# Patient Record
Sex: Female | Born: 1990 | Race: Black or African American | Hispanic: No | Marital: Single | Smoking: Never smoker
Health system: Southern US, Community
[De-identification: ages and names within clinical notes are randomized; demographics above are authoritative.]

## PROBLEM LIST (undated history)

## (undated) HISTORY — PX: TONSILLECTOMY: SUR1361

---

## 2015-02-11 ENCOUNTER — Emergency Department (HOSPITAL_COMMUNITY): Payer: No Typology Code available for payment source

## 2015-02-11 ENCOUNTER — Emergency Department (HOSPITAL_COMMUNITY)
Admission: EM | Admit: 2015-02-11 | Discharge: 2015-02-11 | Disposition: A | Discharge status: Home / Self Care | Payer: No Typology Code available for payment source | Attending: Emergency Medicine | Admitting: Emergency Medicine

## 2015-02-11 ENCOUNTER — Encounter (HOSPITAL_COMMUNITY): Payer: Self-pay | Admitting: *Deleted

## 2015-02-11 DIAGNOSIS — S0993XA Unspecified injury of face, initial encounter: Secondary | ICD-10-CM | POA: Diagnosis not present

## 2015-02-11 DIAGNOSIS — S4992XA Unspecified injury of left shoulder and upper arm, initial encounter: Secondary | ICD-10-CM | POA: Insufficient documentation

## 2015-02-11 DIAGNOSIS — Z3202 Encounter for pregnancy test, result negative: Secondary | ICD-10-CM | POA: Diagnosis not present

## 2015-02-11 DIAGNOSIS — Y92410 Unspecified street and highway as the place of occurrence of the external cause: Secondary | ICD-10-CM | POA: Diagnosis not present

## 2015-02-11 DIAGNOSIS — S0990XA Unspecified injury of head, initial encounter: Secondary | ICD-10-CM | POA: Diagnosis not present

## 2015-02-11 DIAGNOSIS — S3992XA Unspecified injury of lower back, initial encounter: Secondary | ICD-10-CM | POA: Diagnosis not present

## 2015-02-11 DIAGNOSIS — Y999 Unspecified external cause status: Secondary | ICD-10-CM | POA: Diagnosis not present

## 2015-02-11 DIAGNOSIS — R110 Nausea: Secondary | ICD-10-CM | POA: Insufficient documentation

## 2015-02-11 DIAGNOSIS — Z720 Tobacco use: Secondary | ICD-10-CM | POA: Insufficient documentation

## 2015-02-11 DIAGNOSIS — S199XXA Unspecified injury of neck, initial encounter: Secondary | ICD-10-CM | POA: Diagnosis present

## 2015-02-11 DIAGNOSIS — Y9389 Activity, other specified: Secondary | ICD-10-CM | POA: Diagnosis not present

## 2015-02-11 LAB — POC URINE PREG, ED: Preg Test, Ur: NEGATIVE

## 2015-02-11 MED ORDER — METHOCARBAMOL 500 MG PO TABS
500.0000 mg | ORAL_TABLET | Freq: Once | ORAL | Status: AC
  Administered 2015-02-11: 500 mg via ORAL
  Filled 2015-02-11: qty 1

## 2015-02-11 MED ORDER — NAPROXEN 375 MG PO TABS: 375.0000 mg | ORAL_TABLET | Freq: Two times a day (BID) | ORAL | Status: DC

## 2015-02-11 MED ORDER — METHOCARBAMOL 500 MG PO TABS: 500.0000 mg | ORAL_TABLET | Freq: Two times a day (BID) | ORAL | Status: DC

## 2015-02-11 MED ORDER — KETOROLAC TROMETHAMINE 60 MG/2ML IM SOLN
60.0000 mg | Freq: Once | INTRAMUSCULAR | Status: AC
Start: 2015-02-11 — End: 2015-02-11
  Administered 2015-02-11: 60 mg via INTRAMUSCULAR
  Filled 2015-02-11: qty 2
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
## 2015-02-11 NOTE — Discharge Instructions (Signed)

## 2015-08-13 ENCOUNTER — Other Ambulatory Visit (HOSPITAL_COMMUNITY)
Admission: RE | Admit: 2015-08-13 | Discharge: 2015-08-13 | Disposition: A | Discharge status: Home / Self Care | Payer: Managed Care, Other (non HMO) | Source: Ambulatory Visit | Attending: Obstetrics and Gynecology | Admitting: Obstetrics and Gynecology

## 2015-08-13 ENCOUNTER — Other Ambulatory Visit: Payer: Self-pay | Admitting: Obstetrics and Gynecology

## 2015-08-13 DIAGNOSIS — Z113 Encounter for screening for infections with a predominantly sexual mode of transmission: Secondary | ICD-10-CM | POA: Diagnosis present

## 2015-08-13 DIAGNOSIS — Z01419 Encounter for gynecological examination (general) (routine) without abnormal findings: Secondary | ICD-10-CM | POA: Diagnosis present

## 2015-08-14 LAB — CYTOLOGY - PAP

## 2016-07-15 ENCOUNTER — Encounter (HOSPITAL_COMMUNITY): Payer: Self-pay | Admitting: *Deleted

## 2016-07-15 ENCOUNTER — Emergency Department (HOSPITAL_COMMUNITY)
Admission: EM | Admit: 2016-07-15 | Discharge: 2016-07-16 | Disposition: A | Discharge status: Home / Self Care | Payer: Managed Care, Other (non HMO) | Attending: Emergency Medicine | Admitting: Emergency Medicine

## 2016-07-15 DIAGNOSIS — F17210 Nicotine dependence, cigarettes, uncomplicated: Secondary | ICD-10-CM | POA: Insufficient documentation

## 2016-07-15 DIAGNOSIS — N939 Abnormal uterine and vaginal bleeding, unspecified: Secondary | ICD-10-CM

## 2016-07-15 LAB — URINALYSIS, ROUTINE W REFLEX MICROSCOPIC
BILIRUBIN URINE: NEGATIVE
Glucose, UA: NEGATIVE mg/dL
KETONES UR: NEGATIVE mg/dL
LEUKOCYTES UA: NEGATIVE
Nitrite: NEGATIVE
PROTEIN: NEGATIVE mg/dL
Specific Gravity, Urine: 1.017 (ref 1.005–1.030)
pH: 5 (ref 5.0–8.0)

## 2016-07-15 LAB — POC URINE PREG, ED: PREG TEST UR: NEGATIVE

## 2016-07-15 NOTE — ED Provider Notes (Signed)
[facility]-EMERGENCY DEPT Provider Note   CSN: [id number] Arrival date & time: 07/15/16  2024  By signing my name below, I, Cynda Acres, attest that this documentation has been prepared under the direction and in the presence of Brittnei Jagiello PA-C. Electronically Signed: Cynda Acres, Scribe. 07/15/16. 12:27 AM.  History   Chief Complaint Chief Complaint  Patient presents with  . Vaginal Bleeding  . Abdominal Pain   HPI Comments: Sheryl Walker is a 26 y.o. female with no pertinent medical history, who presents to the Emergency Department complaining of sudden-onset, intermittent heavy vaginal bleeding that began this morning. Patient describes her bleeding as "stacks of blood" flowing. Patient states she has changed her tampon multiple times throughout the day. Patient denies any history of anemia. Patient states her symptoms are similar to prior miscarriages.  Patient reports associated lower abdominal pain and vomiting x3 episodes. No modifying factors indicated. Patient states abnormal menstural periods are normal for her, as she is unsure when they will come each month. Patient is sexually active. Patient denies any vaginal discharge, light-headedness, dysuria, or urinary frequency.   The history is provided by the patient. No language interpreter was used.    History reviewed. No pertinent past medical history.  There are no active problems to display for this patient.   Past Surgical History:  Procedure Laterality Date  . TONSILLECTOMY      OB History    No data available       Home Medications    Prior to Admission medications   Medication Sig Start Date End Date Taking? Authorizing Provider  methocarbamol (ROBAXIN) 500 MG tablet Take 1 tablet (500 mg total) by mouth 2 (two) times daily. 02/11/15   April Palumbo, MD  naproxen (NAPROSYN) 375 MG tablet Take 1 tablet (375 mg total) by mouth 2 (two) times daily. 02/11/15   April Palumbo, MD    Family History No family  history on file.  Social History Social History  Substance Use Topics  . Smoking status: Current Every Day Smoker    Packs/day: 0.50    Types: Cigarettes  . Smokeless tobacco: Never Used  . Alcohol use Yes     Allergies   Patient has no known allergies.   Review of Systems Review of Systems  Constitutional: Negative for fever.  Gastrointestinal: Positive for abdominal pain (lower) and vomiting.  Genitourinary: Positive for vaginal bleeding. Negative for dysuria and vaginal discharge.  Musculoskeletal: Negative for back pain.  Skin: Negative for pallor.  Neurological: Negative for light-headedness.     Physical Exam Updated Vital Signs BP (!) 150/89 (BP Location: Left Wrist)   Pulse 100   Temp 98.9 F (37.2 C) (Oral)   Resp 15   Ht 5\' 7"  (1.702 m)   Wt (!) 360 lb (163.3 kg)   LMP 07/15/2016   SpO2 100%   BMI 56.38 kg/m   Physical Exam  Constitutional: She is oriented to person, place, and time. She appears well-developed. No distress.  HENT:  Head: Normocephalic and atraumatic.  Mouth/Throat: Oropharynx is clear and moist.  Eyes:  No conjunctival pallor.   Neck: Normal range of motion. Neck supple.  Cardiovascular: Normal rate.   Pulmonary/Chest: Effort normal.  Abdominal: Soft. Bowel sounds are normal. She exhibits no mass. There is no tenderness.     Genitourinary:  Genitourinary Comments: Minimal cervical bleeding. No products of conception. No other discharge visualized. No cervical tenderness. No adnexal mass or tenderness. Exam limited by body habitus  Neurological: She is  alert and oriented to person, place, and time.  Skin: Skin is warm and dry.     ED Treatments / Results  DIAGNOSTIC STUDIES: Oxygen Saturation is 100% on RA, normal by my interpretation.    COORDINATION OF CARE: 12:27 AM Discussed treatment plan with pt at bedside and pt agreed to plan, which includes pelvic exam and basic labs.   Labs (all labs ordered are listed, but only  abnormal results are displayed) Labs Reviewed  URINALYSIS, ROUTINE W REFLEX MICROSCOPIC - Abnormal; Notable for the following:       Result Value   Hgb urine dipstick LARGE (*)    Bacteria, UA RARE (*)    Squamous Epithelial / LPF 0-5 (*)    All other components within normal limits  POC URINE PREG, ED    EKG  EKG Interpretation None       Radiology No results found.  Procedures Procedures (including critical care time)  Medications Ordered in ED Medications - No data to display   Initial Impression / Assessment and Plan / ED Course  I have reviewed the triage vital signs and the nursing notes.  Pertinent labs & imaging results that were available during my care of the patient were reviewed by me and considered in my medical decision making (see chart for details).     Patient presents with abnormal vaginal bleeding and she was concerned for recurrent miscarriage. The patient reports urine pregnancy tests have been negative in the past when serum tests have shown positive. Serum test also negative. No evidence of infection. She is nontender to exam. No anemia. She is felt stable for discharge home. Will provide referral to GYN.  Final Clinical Impressions(s) / ED Diagnoses   Final diagnoses:  None   1. Abnormal vaginal bleeding  New Prescriptions New Prescriptions   No medications on file   I personally performed the services described in this documentation, which was scribed in my presence. The recorded information has been reviewed and is accurate.     Elpidio AnisShari Molley Houser, PA-C 07/17/16 [phone]    Pricilla LovelessScott Goldston, MD 07/17/16 [phone]

## 2016-07-15 NOTE — ED Triage Notes (Signed)
Pt complains of lower abdominal pain and heavier than normal vaginal bleeding since this morning. Pt denies diarrhea or nausea. Pt states she vomited 3 times this morning.

## 2016-07-16 ENCOUNTER — Encounter (HOSPITAL_COMMUNITY): Payer: Self-pay | Admitting: *Deleted

## 2016-07-16 ENCOUNTER — Inpatient Hospital Stay (HOSPITAL_COMMUNITY)
Admission: AD | Admit: 2016-07-16 | Discharge: 2016-07-16 | Disposition: A | Discharge status: Home / Self Care | Payer: Self-pay | Source: Ambulatory Visit | Attending: Obstetrics and Gynecology | Admitting: Obstetrics and Gynecology

## 2016-07-16 ENCOUNTER — Inpatient Hospital Stay (HOSPITAL_COMMUNITY): Payer: Self-pay

## 2016-07-16 DIAGNOSIS — Z79899 Other long term (current) drug therapy: Secondary | ICD-10-CM | POA: Insufficient documentation

## 2016-07-16 DIAGNOSIS — N938 Other specified abnormal uterine and vaginal bleeding: Secondary | ICD-10-CM | POA: Insufficient documentation

## 2016-07-16 DIAGNOSIS — N970 Female infertility associated with anovulation: Secondary | ICD-10-CM | POA: Insufficient documentation

## 2016-07-16 DIAGNOSIS — R030 Elevated blood-pressure reading, without diagnosis of hypertension: Secondary | ICD-10-CM

## 2016-07-16 DIAGNOSIS — F17210 Nicotine dependence, cigarettes, uncomplicated: Secondary | ICD-10-CM | POA: Insufficient documentation

## 2016-07-16 DIAGNOSIS — N939 Abnormal uterine and vaginal bleeding, unspecified: Secondary | ICD-10-CM

## 2016-07-16 LAB — CBC
HCT: 39.8 % (ref 36.0–46.0)
HEMATOCRIT: 38 % (ref 36.0–46.0)
HEMOGLOBIN: 12.7 g/dL (ref 12.0–15.0)
Hemoglobin: 12.2 g/dL (ref 12.0–15.0)
MCH: 27 pg (ref 26.0–34.0)
MCH: 27.2 pg (ref 26.0–34.0)
MCHC: 32.1 g/dL (ref 30.0–36.0)
MCHC: 31.9 g/dL (ref 30.0–36.0)
MCV: 84.5 fL (ref 78.0–100.0)
MCV: 84.8 fL (ref 78.0–100.0)
PLATELETS: 268 10*3/uL (ref 150–400)
Platelets: 259 10*3/uL (ref 150–400)
RBC: 4.48 MIL/uL (ref 3.87–5.11)
RBC: 4.71 MIL/uL (ref 3.87–5.11)
RDW: 14.7 % (ref 11.5–15.5)
RDW: 14.7 % (ref 11.5–15.5)
WBC: 10.9 10*3/uL — AB (ref 4.0–10.5)
WBC: 8.9 10*3/uL (ref 4.0–10.5)

## 2016-07-16 LAB — URINALYSIS, ROUTINE W REFLEX MICROSCOPIC
BACTERIA UA: NONE SEEN
Bilirubin Urine: NEGATIVE
GLUCOSE, UA: NEGATIVE mg/dL
KETONES UR: NEGATIVE mg/dL
Leukocytes, UA: NEGATIVE
Nitrite: NEGATIVE
PROTEIN: NEGATIVE mg/dL
Specific Gravity, Urine: 1.018 (ref 1.005–1.030)
pH: 7 (ref 5.0–8.0)

## 2016-07-16 LAB — GC/CHLAMYDIA PROBE AMP (~~LOC~~) NOT AT ARMC
CHLAMYDIA, DNA PROBE: NEGATIVE
NEISSERIA GONORRHEA: NEGATIVE

## 2016-07-16 LAB — I-STAT BETA HCG BLOOD, ED (MC, WL, AP ONLY): I-stat hCG, quantitative: <5 m[IU]/mL (ref <5)

## 2016-07-16 LAB — WET PREP, GENITAL
Sperm: NONE SEEN
TRICH WET PREP: NONE SEEN
YEAST WET PREP: NONE SEEN

## 2016-07-16 MED ORDER — MEDROXYPROGESTERONE ACETATE 10 MG PO TABS: 10.0000 mg | ORAL_TABLET | Freq: Every day | ORAL | 0 refills | Status: AC

## 2016-07-16 MED ORDER — NAPROXEN 375 MG PO TABS: 375.0000 mg | ORAL_TABLET | Freq: Two times a day (BID) | ORAL | 0 refills | Status: AC

## 2016-07-16 NOTE — MAU Note (Signed)
Pt c/o bad cramps in lower abdomen for the last three days. Pt states she was seen in ER at [facility] last night and was told everything was OK. Pt states this morning when she took a shower she has several big blood clots come out. Pt states she has been spotting since last Tuesday. Pt states the bleeding increased yesterday and has been on-going since then. Pt states her periods are normally irregular.

## 2016-07-16 NOTE — MAU Provider Note (Signed)
Chief Complaint: Abdominal Pain and Vaginal Bleeding   SUBJECTIVE HPI: Sheryl Walker is a 26 y.o. G2P0020 who presents to Maternity Admissions reporting significant vaginal bleeding with cramping.  Patient was just seen in [facility] just over 12 hours ago for the same complaints. She's had spotting for the past 2 weeks. With significant bleeding starting yesterday and today. Her hemoglobin at the time was 10.9, without any significant vaginal bleeding noted on exam. Patient was sent home from the ER stable and told to follow up with OB/GYN. Patient returned this time to MAU due to 5-6 large blood clots that had been passed throughout the day, about the size of baseballs. She is not soaking through any pads, but having to change a super size tampon every few hours or so. She'll occasionally go times without any bleeding throughout the day but then would pass large clots as noted above. She states that she has very infrequent menses throughout the year, but has never had large bleeding like this before. She has tried nexplanon in the past with good success, not having any vaginal bleeding during the implant for 3 years. She has tried OCPs, but that did not help regulate her periods. She has been off birth control for a couple of years. She admits to some mild pelvic cramping that is also associated with some nausea. She has an appointment with her OB/GYN on April 30, but has not been seen for a while.     History reviewed. No pertinent past medical history. OB History  Gravida Para Term Preterm AB Living  2       2    SAB TAB Ectopic Multiple Live Births  2            # Outcome Date GA Lbr Len/2nd Weight Sex Delivery Anes PTL Lv  2 SAB 2010          1 SAB 2010             Past Surgical History:  Procedure Laterality Date  . TONSILLECTOMY     Social History   Social History  . Marital status: Single    Spouse name: N/A  . Number of children: N/A  . Years of education: N/A    Occupational History  . Not on file.   Social History Main Topics  . Smoking status: Current Every Day Smoker    Packs/day: 0.50    Types: Cigarettes  . Smokeless tobacco: Never Used  . Alcohol use Yes  . Drug use: Yes    Types: Marijuana  . Sexual activity: Yes    Birth control/ protection: None   Other Topics Concern  . Not on file   Social History Narrative  . No narrative on file   No current facility-administered medications on file prior to encounter.    Current Outpatient Prescriptions on File Prior to Encounter  Medication Sig Dispense Refill  . acidophilus (RISAQUAD) CAPS capsule Take by mouth daily.    . naproxen (NAPROSYN) 375 MG tablet Take 1 tablet (375 mg total) by mouth 2 (two) times daily. 20 tablet 0  . OVER THE COUNTER MEDICATION Take 1 tablet by mouth daily.     No Known Allergies  I have reviewed the past Medical Hx, Surgical Hx, Social Hx, Allergies and Medications.   REVIEW OF SYSTEMS  A comprehensive ROS was negative except per HPI.   OBJECTIVE Patient Vitals for the past 24 hrs:  BP Temp Temp src Pulse Resp SpO2  07/16/16 2000 106/68 - - 95 - -  07/16/16 1723 (!) 150/72 98.7 F (37.1 C) Oral 93 18 100 %    PHYSICAL EXAM Constitutional: Well-developed, well-nourished severely morbidly obese female in no acute distress.  Cardiovascular: normal rate Respiratory: normal rate and effort.  GI: Abd soft, non-tender, non-distended. Pos BS x 4 MS: Extremities nontender, no edema, normal ROM Neurologic: Alert and oriented x 4.  GU: Neg CVAT. SPECULUM EXAM: NEFG, physiologic discharge, no active bleeding from cervical os, no clots noted in posterior fornix, <3cc dark red blood, cervix clean BIMANUAL: cervix closed; uterus normal size, no adnexal tenderness or masses but very limited due to morbidly obese. No CMT.  LAB RESULTS Results for orders placed or performed during the hospital encounter of 07/16/16 (from the past 24 hour(s))   Urinalysis, Routine w reflex microscopic     Status: Abnormal   Collection Time: 07/16/16  5:20 PM  Result Value Ref Range   Color, Urine YELLOW YELLOW   APPearance CLEAR CLEAR   Specific Gravity, Urine 1.018 1.005 - 1.030   pH 7.0 5.0 - 8.0   Glucose, UA NEGATIVE NEGATIVE mg/dL   Hgb urine dipstick SMALL (A) NEGATIVE   Bilirubin Urine NEGATIVE NEGATIVE   Ketones, ur NEGATIVE NEGATIVE mg/dL   Protein, ur NEGATIVE NEGATIVE mg/dL   Nitrite NEGATIVE NEGATIVE   Leukocytes, UA NEGATIVE NEGATIVE   RBC / HPF 0-5 0 - 5 RBC/hpf   WBC, UA 0-5 0 - 5 WBC/hpf   Bacteria, UA NONE SEEN NONE SEEN   Squamous Epithelial / LPF 0-5 (A) NONE SEEN   Mucous PRESENT   CBC     Status: None   Collection Time: 07/16/16  6:08 PM  Result Value Ref Range   WBC 8.9 4.0 - 10.5 K/uL   RBC 4.71 3.87 - 5.11 MIL/uL   Hemoglobin 12.7 12.0 - 15.0 g/dL   HCT 69.6 29.5 - 28.4 %   MCV 84.5 78.0 - 100.0 fL   MCH 27.0 26.0 - 34.0 pg   MCHC 31.9 30.0 - 36.0 g/dL   RDW 13.2 44.0 - 10.2 %   Platelets 259 150 - 400 K/uL    IMAGING US Transvaginal Non-ob  Result Date: 07/16/2016 CLINICAL DATA:  Initial evaluation for acute abdominal/ pelvic cramping, abnormal uterine bleeding. History of morbid obesity. EXAM: TRANSABDOMINAL AND TRANSVAGINAL ULTRASOUND OF PELVIS TECHNIQUE: Both transabdominal and transvaginal ultrasound examinations of the pelvis were performed. Transabdominal technique was performed for global imaging of the pelvis including uterus, ovaries, adnexal regions, and pelvic cul-de-sac. It was necessary to proceed with endovaginal exam following the transabdominal exam to visualize the uterus and ovaries. COMPARISON:  None FINDINGS: Uterus Measurements: 7.6 x 2.7 x 2.5 cm. No fibroids or other mass visualized. Endometrium Thickness: 8.4 mm.  No focal abnormality visualized. Right ovary Measurements: 3.0 x 1.5 x 1.4 cm. Grossly normal appearance/no adnexal mass. Left ovary Measurements: 2.4 x 1.6 x 1.7 cm. Grossly  normal appearance/no adnexal mass. Other findings No abnormal free fluid. Please note study somewhat limited due to patient body habitus. IMPRESSION: Negative pelvic ultrasound.  No acute abnormality identified. Electronically Signed   By: Rise Mu M.D.   On: 07/16/2016 19:40   US Pelvis Complete  Result Date: 07/16/2016 CLINICAL DATA:  Initial evaluation for acute abdominal/ pelvic cramping, abnormal uterine bleeding. History of morbid obesity. EXAM: TRANSABDOMINAL AND TRANSVAGINAL ULTRASOUND OF PELVIS TECHNIQUE: Both transabdominal and transvaginal ultrasound examinations of the pelvis were performed. Transabdominal technique was performed for global  imaging of the pelvis including uterus, ovaries, adnexal regions, and pelvic cul-de-sac. It was necessary to proceed with endovaginal exam following the transabdominal exam to visualize the uterus and ovaries. COMPARISON:  None FINDINGS: Uterus Measurements: 7.6 x 2.7 x 2.5 cm. No fibroids or other mass visualized. Endometrium Thickness: 8.4 mm.  No focal abnormality visualized. Right ovary Measurements: 3.0 x 1.5 x 1.4 cm. Grossly normal appearance/no adnexal mass. Left ovary Measurements: 2.4 x 1.6 x 1.7 cm. Grossly normal appearance/no adnexal mass. Other findings No abnormal free fluid. Please note study somewhat limited due to patient body habitus. IMPRESSION: Negative pelvic ultrasound.  No acute abnormality identified. Electronically Signed   By: Rise Mu M.D.   On: 07/16/2016 19:40    MAU COURSE Hb 10.9 (last night) > 8.9 (today) SSE - minimal blood noted, no active bleeding or clots TVUS - normal ovaries, no fibroids, 8.31mm endometrium thickness (reviewed labs from last night ER visit, neg BHCG, neg GC/CT)  7:24 PM - Spoke with Dr. Su Hilt, suggested to start Provera  qday for 14 days with follow up in office before end of pills. OK for discharge.  MDM Plan of care reviewed with patient, including labs and tests  ordered and medical treatment. Bleeding precautions for when to return to ER. Provera for 14 days until able to follow up with OB/GYN. To call office to have follow up before 2 weeks.    ASSESSMENT 1. Anovulatory (dysfunctional uterine) bleeding   2. Abnormal uterine bleeding   3. Elevated blood pressure reading without diagnosis of hypertension     PLAN Discharge home in stable condition. Provera Bleeding precautions F/U w/ OB/GYN  Follow-up Information    COLE,TARA J., MD. Schedule an appointment as soon as possible for a visit in 1 week(s).   Specialty:  Obstetrics and Gynecology Why:  Follow up in 1-2 weeks in office for bleeding before finish pills Contact information: [address] [place] [address] [phone]          Allergies as of 07/16/2016   No Known Allergies     Medication List    STOP taking these medications   methocarbamol 500 MG tablet Commonly known as:  ROBAXIN     TAKE these medications   acidophilus Caps capsule Take by mouth daily.   medroxyPROGESTERone 10 MG tablet Commonly known as:  PROVERA Take 1 tablet (10 mg total) by mouth daily. Take for 2 weeks.   naproxen 375 MG tablet Commonly known as:  NAPROSYN Take 1 tablet (375 mg total) by mouth 2 (two) times daily.   OVER THE COUNTER MEDICATION Take 1 tablet by mouth daily.        Jen Mow, DO [place] [facility] 07/16/2016 7:24 PM

## 2016-07-16 NOTE — Discharge Instructions (Signed)
Abnormal Uterine Bleeding Abnormal uterine bleeding means bleeding more than usual from your uterus. It can include:  Bleeding between periods.  Bleeding after sex.  Bleeding that is heavier than normal.  Periods that last longer than usual.  Bleeding after you have stopped having your period (menopause). There are many problems that may cause this. You should see a doctor for any kind of bleeding that is not normal. Treatment depends on the cause of the bleeding. Follow these instructions at home:  Watch your condition for any changes.  Do not use tampons, douche, or have sex, if your doctor tells you not to.  Change your pads often.  Get regular well-woman exams. Make sure they include a pelvic exam and cervical cancer screening.  Keep all follow-up visits as told by your doctor. This is important. Contact a doctor if:  The bleeding lasts more than one week.  You feel dizzy at times.  You feel like you are going to throw up (nauseous).  You throw up. Get help right away if:  You pass out.  You have to change pads every hour.  You have belly (abdominal) pain.  You have a fever.  You get sweaty.  You get weak.  You passing large blood clots from your vagina. Summary  Abnormal uterine bleeding means bleeding more than usual from your uterus.  There are many problems that may cause this. You should see a doctor for any kind of bleeding that is not normal.  Treatment depends on the cause of the bleeding. This information is not intended to replace advice given to you by your health care provider. Make sure you discuss any questions you have with your health care provider. Document Released: 01/31/2009 Document Revised: 03/30/2016 Document Reviewed: 03/30/2016 Elsevier Interactive Patient Education  2017 [employer]   Dysfunctional Uterine Bleeding Dysfunctional uterine bleeding is abnormal bleeding from the uterus. Dysfunctional uterine bleeding  includes:  A period that comes earlier or later than usual.  A period that is lighter, heavier, or has blood clots.  Bleeding between periods.  Skipping one or more periods.  Bleeding after sexual intercourse.  Bleeding after menopause. Follow these instructions at home: Pay attention to any changes in your symptoms. Follow these instructions to help with your condition: Eating and drinking   Eat well-balanced meals. Include foods that are high in iron, such as liver, meat, shellfish, green leafy vegetables, and eggs.  If you become constipated:  Drink plenty of water.  Eat fruits and vegetables that are high in water and fiber, such as spinach, carrots, raspberries, apples, and mango. Medicines   Take over-the-counter and prescription medicines only as told by your health care provider.  Do not change medicines without talking with your health care provider.  Aspirin or medicines that contain aspirin may make the bleeding worse. Do not take those medicines:  During the week before your period.  During your period.  If you were prescribed iron pills, take them as told by your health care provider. Iron pills help to replace iron that your body loses because of this condition. Activity   If you need to change your sanitary pad or tampon more than one time every 2 hours:  Lie in bed with your feet raised (elevated).  Place a cold pack on your lower abdomen.  Rest as much as possible until the bleeding stops or slows down.  Do not try to lose weight until the bleeding has stopped and your blood iron level is  back to normal. Other Instructions   For two months, write down:  When your period starts.  When your period ends.  When any abnormal bleeding occurs.  What problems you notice.  Keep all follow up visits as told by your health care provider. This is important. Contact a health care provider if:  You get light-headed or weak.  You have nausea and  vomiting.  You cannot eat or drink without vomiting.  You feel dizzy or have diarrhea while you are taking medicines.  You are taking birth control pills or hormones, and you want to change them or stop taking them. Get help right away if:  You develop a fever or chills.  You need to change your sanitary pad or tampon more than one time per hour.  Your bleeding becomes heavier, or your flow contains clots more often.  You develop pain in your abdomen.  You lose consciousness.  You develop a rash. This information is not intended to replace advice given to you by your health care provider. Make sure you discuss any questions you have with your health care provider. Document Released: 04/02/2000 Document Revised: 09/11/2015 Document Reviewed: 07/01/2014 Elsevier Interactive Patient Education  2017 [employer] Inc.   Polycystic Ovarian Syndrome Polycystic ovarian syndrome (PCOS) is a common hormonal disorder among women of reproductive age. In most women with PCOS, many small fluid-filled sacs (cysts) grow on the ovaries, and the cysts are not part of a normal menstrual cycle. PCOS can cause problems with your menstrual periods and make it difficult to get pregnant. It can also cause an increased risk of miscarriage with pregnancy. If it is not treated, PCOS can lead to serious health problems, such as diabetes and heart disease. What are the causes? The cause of PCOS is not known, but it may be the result of a combination of certain factors, such as:  Irregular menstrual cycle.  High levels of certain hormones (androgens).  Problems with the hormone that helps to control blood sugar (insulin resistance).  Certain genes. What increases the risk? This condition is more likely to develop in women who have a family history of PCOS. What are the signs or symptoms? Symptoms of PCOS may include:  Multiple ovarian cysts.  Infrequent periods or no periods.  Periods that are too  frequent or too heavy.  Unpredictable periods.  Inability to get pregnant (infertility) because of not ovulating.  Increased growth of hair on the face, chest, stomach, back, thumbs, thighs, or toes.  Acne or oily skin. Acne may develop during adulthood, and it may not respond to treatment.  Pelvic pain.  Weight gain or obesity.  Patches of thickened and dark brown or black skin on the neck, arms, breasts, or thighs (acanthosis nigricans).  Excess hair growth on the face, chest, abdomen, or upper thighs (hirsutism). How is this diagnosed? This condition is diagnosed based on:  Your medical history.  A physical exam, including a pelvic exam. Your health care provider may look for areas of increased hair growth on your skin.  Tests, such as:  Ultrasound. This may be used to examine the ovaries and the lining of the uterus (endometrium) for cysts.  Blood tests. These may be used to check levels of sugar (glucose), female hormone (testosterone), and female hormones (estrogen and progesterone) in your blood. How is this treated? There is no cure for PCOS, but treatment can help to manage symptoms and prevent more health problems from developing. Treatment varies depending on:  Your  symptoms.  Whether you want to have a baby or whether you need birth control (contraception). Treatment may include nutrition and lifestyle changes along with:  Progesterone hormone to start a menstrual period.  Birth control pills to help you have regular menstrual periods.  Medicines to make you ovulate, if you want to get pregnant.  Medicine to reduce excessive hair growth.  Surgery, in severe cases. This may involve making small holes in one or both of your ovaries. This decreases the amount of testosterone that your body produces. Follow these instructions at home:  Take over-the-counter and prescription medicines only as told by your health care provider.  Follow a healthy meal plan. This  can help you reduce the effects of PCOS.  Eat a healthy diet that includes lean proteins, complex carbohydrates, fresh fruits and vegetables, low-fat dairy products, and healthy fats. Make sure to eat enough fiber.  If you are overweight, lose weight as told by your health care provider.  Losing 10% of your body weight may improve symptoms.  Your health care provider can determine how much weight loss is best for you and can help you lose weight safely.  Keep all follow-up visits as told by your health care provider. This is important. Contact a health care provider if:  Your symptoms do not get better with medicine.  You develop new symptoms. This information is not intended to replace advice given to you by your health care provider. Make sure you discuss any questions you have with your health care provider. Document Released: 07/30/2004 Document Revised: 12/02/2015 Document Reviewed: 09/21/2015 Elsevier Interactive Patient Education  2017 [employer].

## 2016-08-11 ENCOUNTER — Encounter (HOSPITAL_COMMUNITY): Payer: Self-pay | Admitting: Emergency Medicine

## 2016-08-11 ENCOUNTER — Emergency Department (HOSPITAL_COMMUNITY)
Admission: EM | Admit: 2016-08-11 | Discharge: 2016-08-11 | Disposition: A | Discharge status: Home / Self Care | Payer: Self-pay | Attending: Emergency Medicine | Admitting: Emergency Medicine

## 2016-08-11 DIAGNOSIS — F17210 Nicotine dependence, cigarettes, uncomplicated: Secondary | ICD-10-CM | POA: Insufficient documentation

## 2016-08-11 DIAGNOSIS — Z79899 Other long term (current) drug therapy: Secondary | ICD-10-CM | POA: Insufficient documentation

## 2016-08-11 DIAGNOSIS — N939 Abnormal uterine and vaginal bleeding, unspecified: Secondary | ICD-10-CM | POA: Insufficient documentation

## 2016-08-11 LAB — I-STAT CHEM 8, ED
BUN: 4 mg/dL — ABNORMAL LOW (ref 6–20)
CALCIUM ION: 1.1 mmol/L — AB (ref 1.15–1.40)
CHLORIDE: 101 mmol/L (ref 101–111)
CREATININE: 0.8 mg/dL (ref 0.44–1.00)
GLUCOSE: 77 mg/dL (ref 65–99)
HCT: 37 % (ref 36.0–46.0)
Hemoglobin: 12.6 g/dL (ref 12.0–15.0)
POTASSIUM: 3.5 mmol/L (ref 3.5–5.1)
Sodium: 140 mmol/L (ref 135–145)
TCO2: 27 mmol/L (ref 0–100)

## 2016-08-11 LAB — WET PREP, GENITAL
Clue Cells Wet Prep HPF POC: NONE SEEN
Sperm: NONE SEEN
Trich, Wet Prep: NONE SEEN
YEAST WET PREP: NONE SEEN

## 2016-08-11 LAB — POC URINE PREG, ED: PREG TEST UR: NEGATIVE

## 2016-08-11 MED ORDER — MEGESTROL ACETATE 40 MG PO TABS: 80.0000 mg | ORAL_TABLET | Freq: Two times a day (BID) | ORAL | 0 refills | Status: AC

## 2016-08-11 MED ORDER — TRAMADOL HCL 50 MG PO TABS: 50.0000 mg | ORAL_TABLET | Freq: Four times a day (QID) | ORAL | 0 refills | Status: AC | PRN

## 2016-08-11 NOTE — ED Triage Notes (Signed)
One month patient diagnosed with endometriosis.  Patient has been having vaginal bleeding x 2 months.  Prescribed meds to prevent it but has increased.

## 2016-08-11 NOTE — ED Provider Notes (Signed)
[facility]-EMERGENCY DEPT Provider Note   CSN: [id number] Arrival date & time: 08/11/16  1801  By signing my name below, I, Marnette Burgess Long, attest that this documentation has been prepared under the direction and in the presence of Alvira Monday, MD. Electronically Signed: Christian Mate, Scribe. 08/11/2016. 7:47 PM.  History   Chief Complaint Chief Complaint  Patient presents with  . Vaginal Bleeding   The history is provided by the patient and medical records. No language interpreter was used.    HPI Comments:  Sheryl Walker is a morbidly obese 26 y.o. female with a PMHx of PCOS, who presents to the Emergency Department complaining of persistent vaginal bleeding onset two months ago. Per chart review, pt was seen at [facility] for same on 07/15/16 followed by an admission to Maternity Admission after passing clots. She was Dx'd with endometriosis at that time and prescribed medication to prevent the bleeding, however, the bleeding has increased as of late leading her to be seen in the ED today. Per MAU note, pt has an upcoming appointment with OB/GYN on 08/16/16. Until that time, the doctor had her double the Medroxyprogesterone from  to  starting 08/09/16. This morning, she noticed heavy, persistent bleeding while on the phone with the OB/GYN's receptionist at which time she took one  pill which slowed her bleeding. During these times of increased bleeding, she states using a Superplus Pad every 49mins-1hour. She notes reduced PO intake d/t her left-sided abdominal pain and associated symptoms of chills, mild light-headedness, and nausea. She tried Midol and Motrin at home with minimal relief of her pain. Pt denies diarrhea, fever, vaginal discharge, numbness, dysuria, hematuria, and any other complaints at this time. Pt is a current every day smoker.    History reviewed. No pertinent past medical history.  Patient Active Problem List   Diagnosis Date Noted  . Elevated blood pressure  reading without diagnosis of hypertension 07/16/2016   Past Surgical History:  Procedure Laterality Date  . TONSILLECTOMY     OB History    Gravida Para Term Preterm AB Living   2       2     SAB TAB Ectopic Multiple Live Births   2             Home Medications    Prior to Admission medications   Medication Sig Start Date End Date Taking? Authorizing Provider  acidophilus (RISAQUAD) CAPS capsule Take by mouth daily.    Historical Provider, MD  medroxyPROGESTERone (PROVERA) 10 MG tablet Take 1 tablet (10 mg total) by mouth daily. Take for 2 weeks. 07/16/16   Hiram Comber Mumaw, DO  megestrol (MEGACE) 40 MG tablet Take 2 tablets (80 mg total) by mouth 2 (two) times daily. 08/11/16 08/18/16  Alvira Monday, MD  naproxen (NAPROSYN) 375 MG tablet Take 1 tablet (375 mg total) by mouth 2 (two) times daily. 07/16/16   Elpidio Anis, PA-C  OVER THE COUNTER MEDICATION Take 1 tablet by mouth daily.    Historical Provider, MD  traMADol (ULTRAM) 50 MG tablet Take 1 tablet (50 mg total) by mouth every 6 (six) hours as needed. 08/11/16   Alvira Monday, MD    Family History History reviewed. No pertinent family history.  Social History Social History  Substance Use Topics  . Smoking status: Current Every Day Smoker    Packs/day: 0.50    Types: Cigarettes  . Smokeless tobacco: Never Used  . Alcohol use Yes     Allergies  Patient has no known allergies.   Review of Systems Review of Systems  Constitutional: Positive for appetite change and chills. Negative for fever.  HENT: Negative for sore throat.   Eyes: Negative for visual disturbance.  Respiratory: Negative for cough and shortness of breath.   Cardiovascular: Negative for chest pain.  Gastrointestinal: Positive for abdominal pain and nausea. Negative for diarrhea.  Genitourinary: Positive for menstrual problem and vaginal bleeding. Negative for difficulty urinating, dysuria, hematuria and vaginal discharge.    Musculoskeletal: Negative for back pain and neck pain.  Skin: Negative for rash.  Neurological: Positive for light-headedness (mild). Negative for syncope, numbness and headaches.     Physical Exam Updated Vital Signs BP (!) 130/98   Pulse 84   Temp 98.2 F (36.8 C) (Oral)   Resp 18   Ht  (1.702 m)   Wt (!) 360 lb (163.3 kg)   LMP 07/15/2016   SpO2 100%   BMI 56.38 kg/m   Physical Exam  Constitutional: She is oriented to person, place, and time. She appears well-developed and well-nourished. No distress.  HENT:  Head: Normocephalic and atraumatic.  Eyes: Conjunctivae and EOM are normal.  Neck: Normal range of motion.  Cardiovascular: Normal rate, regular rhythm, normal heart sounds and intact distal pulses.  Exam reveals no gallop and no friction rub.   No murmur heard. Pulmonary/Chest: Effort normal and breath sounds normal. No respiratory distress. She has no wheezes. She has no rales.  Abdominal: Soft. She exhibits no distension. There is no tenderness. There is no guarding.  Genitourinary: Pelvic exam was performed with patient supine. Uterus is tender. Right adnexum displays tenderness. Left adnexum displays tenderness.  Genitourinary Comments: Scant blood at the cervix, no active hemorrhage. No pooling of blood.   Musculoskeletal: Normal range of motion. She exhibits no edema or tenderness.  Neurological: She is alert and oriented to person, place, and time. No cranial nerve deficit or sensory deficit. She exhibits normal muscle tone. Coordination normal.  Skin: Skin is warm and dry. No rash noted. She is not diaphoretic. No erythema.  Psychiatric: She has a normal mood and affect.  Nursing note and vitals reviewed.   FEMALE CHAPERONE PRESENT FOR PHYSICAL EXAMINATION  ED Treatments / Results  DIAGNOSTIC STUDIES:  Oxygen Saturation is 100% on RA, normal by my interpretation.    COORDINATION OF CARE:  7:42 PM Discussed treatment plan with pt at bedside  including POC Urine and pt agreed to plan.  Labs (all labs ordered are listed, but only abnormal results are displayed) Labs Reviewed  WET PREP, GENITAL - Abnormal; Notable for the following:       Result Value   WBC, Wet Prep HPF POC MODERATE (*)    All other components within normal limits  I-STAT CHEM 8, ED - Abnormal; Notable for the following:    BUN 4 (*)    Calcium, Ion 1.10 (*)    All other components within normal limits  POC URINE PREG, ED  GC/CHLAMYDIA PROBE AMP ([place]) NOT AT [facility]    EKG  EKG Interpretation None       Radiology No results found.  Procedures Procedures (including critical care time)  Medications Ordered in ED Medications - No data to display   Initial Impression / Assessment and Plan / ED Course  I have reviewed the triage vital signs and the nursing notes.  Pertinent labs & imaging results that were available during my care of the patient were reviewed by me  and considered in my medical decision making (see chart for details).     26 year old female with a history of PCOS, Obesity, recently diagnosed endometriosis, presents with worsening of her 2 months of vaginal bleeding. Patient on history reports significant amount of bleeding beginning this morning, soaking a super plus tampon every half hour, however reports taking her Provera dose earlier than rx this afternoon, and since that time it has slowed.  I-STAT hemoglobin is within normal limits of 12.6. Her vital signs are within normal limits. She has no significant bleeding on exam.  Patient requesting STI testing, declines empiric treatment. Pregnancy test negative.   Discussed with Dr. Meredith Leeds given amount of bleeding described by patient and agree to increase her provera from once daily to  BID to help decrease bleeding.  When rx is out, will change to megace  BID.  Given short rx for tramadol for pain and discussed dangers of narcotic use, recommendation for primarily  using NSAIDs and tylenol for pain. Evaluated pt in [place] drug database with no rx in last 6 mos.  Patient discharged in stable condition with understanding of reasons to return.    Final Clinical Impressions(s) / ED Diagnoses   Final diagnoses:  Vaginal bleeding    New Prescriptions Discharge Medication List as of 08/11/2016  9:50 PM    START taking these medications   Details  megestrol (MEGACE) 40 MG tablet Take 2 tablets (80 mg total) by mouth 2 (two) times daily., Starting Wed 08/11/2016, Until Wed 08/18/2016, Print    traMADol (ULTRAM) 50 MG tablet Take 1 tablet (50 mg total) by mouth every 6 (six) hours as needed., Starting Wed 08/11/2016, Print        I personally performed the services described in this documentation, which was scribed in my presence. The recorded information has been reviewed and is accurate.     Alvira Monday, MD 08/11/16 2237

## 2016-08-11 NOTE — Discharge Instructions (Signed)
Change your dose of provera to  TWICE daily. When this prescription is done and if you are having bleeding you may switch to Megace rx as provided.  Take tylenol  4 times daily and ibuprofen  4 times daily for pain.

## 2016-08-12 LAB — GC/CHLAMYDIA PROBE AMP (~~LOC~~) NOT AT ARMC
CHLAMYDIA, DNA PROBE: NEGATIVE
NEISSERIA GONORRHEA: NEGATIVE

## 2016-08-24 ENCOUNTER — Inpatient Hospital Stay (HOSPITAL_COMMUNITY)
Admission: AD | Admit: 2016-08-24 | Discharge: 2016-08-24 | Disposition: A | Discharge status: Home / Self Care | Payer: Self-pay | Source: Ambulatory Visit | Attending: Family Medicine | Admitting: Family Medicine

## 2016-08-24 ENCOUNTER — Encounter (HOSPITAL_COMMUNITY): Payer: Self-pay | Admitting: *Deleted

## 2016-08-24 DIAGNOSIS — K529 Noninfective gastroenteritis and colitis, unspecified: Secondary | ICD-10-CM

## 2016-08-24 DIAGNOSIS — A084 Viral intestinal infection, unspecified: Secondary | ICD-10-CM | POA: Insufficient documentation

## 2016-08-24 DIAGNOSIS — N938 Other specified abnormal uterine and vaginal bleeding: Secondary | ICD-10-CM | POA: Insufficient documentation

## 2016-08-24 LAB — URINALYSIS, ROUTINE W REFLEX MICROSCOPIC
Bilirubin Urine: NEGATIVE
GLUCOSE, UA: NEGATIVE mg/dL
Hgb urine dipstick: NEGATIVE
Ketones, ur: NEGATIVE mg/dL
LEUKOCYTES UA: NEGATIVE
NITRITE: NEGATIVE
Protein, ur: NEGATIVE mg/dL
SPECIFIC GRAVITY, URINE: 1.027 (ref 1.005–1.030)
pH: 6 (ref 5.0–8.0)

## 2016-08-24 LAB — POCT PREGNANCY, URINE: Preg Test, Ur: NEGATIVE

## 2016-08-24 MED ORDER — ONDANSETRON 8 MG PO TBDP
8.0000 mg | ORAL_TABLET | Freq: Once | ORAL | Status: AC
  Administered 2016-08-24: 8 mg via ORAL
  Filled 2016-08-24: qty 1

## 2016-08-24 MED ORDER — ONDANSETRON HCL 8 MG PO TABS: 8.0000 mg | ORAL_TABLET | Freq: Three times a day (TID) | ORAL | 0 refills | Status: AC | PRN

## 2016-08-24 NOTE — MAU Provider Note (Signed)
History     CSN: [id number]  Arrival date and time: 08/24/16 1419   First Provider Initiated Contact with Patient 08/24/16 1542      Chief Complaint  Patient presents with  . Nausea  . Emesis  . Vaginal Bleeding   Patient is a 26 year old G2 P0 who presents today with vomiting diarrhea and lower abdominal pain. Incidentally she notes she ran out of her Provera and started having vaginal bleeding as well. She reports she ate [place] mikes yesterday and believe this likely is the cause of her vomiting and diarrhea. She is worried she may have food poisoning. She has not had a fever but his thrown up 7 times since midnight. She is also had multiple episodes of loose stool. She reports no lightheadedness at this time. She does report she had does have an appetite and would like to try to eat something. At the time of my exam she had no abdominal pain.    OB History    Gravida Para Term Preterm AB Living   2       2     SAB TAB Ectopic Multiple Live Births   2              History reviewed. No pertinent past medical history.  Past Surgical History:  Procedure Laterality Date  . TONSILLECTOMY      History reviewed. No pertinent family history.  Social History  Substance Use Topics  . Smoking status: Never Smoker  . Smokeless tobacco: Never Used  . Alcohol use Yes     Comment: occasional    Allergies: No Known Allergies  Prescriptions Prior to Admission  Medication Sig Dispense Refill Last Dose  . acidophilus (RISAQUAD) CAPS capsule Take by mouth daily.   07/15/2016 at Unknown time  . medroxyPROGESTERone (PROVERA) 10 MG tablet Take 1 tablet (10 mg total) by mouth daily. Take for 2 weeks. 14 tablet 0   . naproxen (NAPROSYN) 375 MG tablet Take 1 tablet (375 mg total) by mouth 2 (two) times daily. 20 tablet 0   . OVER THE COUNTER MEDICATION Take 1 tablet by mouth daily.   07/15/2016 at Unknown time  . traMADol (ULTRAM) 50 MG tablet Take 1 tablet (50 mg total) by mouth every 6  (six) hours as needed. 10 tablet 0     Review of Systems  Constitutional: Negative for chills and fever.  HENT: Negative for congestion and rhinorrhea.   Respiratory: Negative for cough and shortness of breath.   Cardiovascular: Negative for chest pain and palpitations.  Gastrointestinal: Positive for diarrhea, nausea and vomiting. Negative for abdominal distention, abdominal pain and constipation.  Genitourinary: Negative for difficulty urinating, dysuria and frequency.  Musculoskeletal: Negative for back pain and myalgias.  Neurological: Negative for dizziness and weakness.   Physical Exam   Blood pressure 105/67, pulse 98, temperature 98.3 F (36.8 C), temperature source Oral, resp. rate 18, height 5\' 7"  (1.702 m), weight (!) 351 lb 1.3 oz (159.2 kg), SpO2 100 %.  Physical Exam  Vitals reviewed. Constitutional: She is oriented to person, place, and time. She appears well-developed and well-nourished.  HENT:  Head: Normocephalic and atraumatic.  Mouth/Throat: Oropharynx is clear and moist.  Cardiovascular: Normal rate and intact distal pulses.   Respiratory: Effort normal. No respiratory distress.  GI: Soft. Bowel sounds are normal. She exhibits no distension. There is no rebound and no guarding.  Mild epigastric tenderness  Musculoskeletal: Normal range of motion. She exhibits no edema.  Neurological:  She is alert and oriented to person, place, and time.  Skin: Skin is warm and dry.  Psychiatric: She has a normal mood and affect. Her behavior is normal.    MAU Course  Procedures  MDM In MA U patient received 8 mg of sublingual Zofran. After administration of this medicine she was able to tolerate crackers and ginger ale and MAU. She had no episodes of vomiting and/or diarrhea while in MA U. Patient's vitals remained stable. Patient is okay for discharge home. Assessment and Plan  #1: Viral gastroenteritis. Prescription printed for Zofran 8 mg by mouth. Patient can continue  to hydrate by mouth Zofran on board. Patient should not babysit at this time until symptoms improve. #2: Dysfunctional uterine bleeding had improved with administration of Provera. Patient did not make follow-up appointment as directed. Advised patient she should make follow-up appointment for further management.  Ernestina Pennaicholas November Sypher 08/24/2016, 3:55 PM

## 2016-08-24 NOTE — Discharge Instructions (Signed)
Food Choices to Help Relieve Diarrhea, Adult When you have diarrhea, the foods you eat and your eating habits are very important. Choosing the right foods and drinks can help:  Relieve diarrhea.  Replace lost fluids and nutrients.  Prevent dehydration.  What general guidelines should I follow? Relieving diarrhea  Choose foods with less than 2 g or .07 oz. of fiber per serving.  Limit fats to less than 8 tsp (38 g or 1.34 oz.) a day.  Avoid the following: ? Foods and beverages sweetened with high-fructose corn syrup, honey, or sugar alcohols such as xylitol, sorbitol, and mannitol. ? Foods that contain a lot of fat or sugar. ? Fried, greasy, or spicy foods. ? High-fiber grains, breads, and cereals. ? Raw fruits and vegetables.  Eat foods that are rich in probiotics. These foods include dairy products such as yogurt and fermented milk products. They help increase healthy bacteria in the stomach and intestines (gastrointestinal tract, or GI tract).  If you have lactose intolerance, avoid dairy products. These may make your diarrhea worse.  Take medicine to help stop diarrhea (antidiarrheal medicine) only as told by your health care provider. Replacing nutrients  Eat small meals or snacks every 3-4 hours.  Eat bland foods, such as white rice, toast, or baked potato, until your diarrhea starts to get better. Gradually reintroduce nutrient-rich foods as tolerated or as told by your health care provider. This includes: ? Well-cooked protein foods. ? Peeled, seeded, and soft-cooked fruits and vegetables. ? Low-fat dairy products.  Take vitamin and mineral supplements as told by your health care provider. Preventing dehydration   Start by sipping water or a special solution to prevent dehydration (oral rehydration solution, ORS). Urine that is clear or pale yellow means that you are getting enough fluid.  Try to drink at least 8-10 cups of fluid each day to help replace lost  fluids.  You may add other liquids in addition to water, such as clear juice or decaffeinated sports drinks, as tolerated or as told by your health care provider.  Avoid drinks with caffeine, such as coffee, tea, or soft drinks.  Avoid alcohol. What foods are recommended? The items listed may not be a complete list. Talk with your health care provider about what dietary choices are best for you. Grains White rice. White, French, or pita breads (fresh or toasted), including plain rolls, buns, or bagels. White pasta. Saltine, soda, or graham crackers. Pretzels. Low-fiber cereal. Cooked cereals made with water (such as cornmeal, farina, or cream cereals). Plain muffins. Matzo. Melba toast. Zwieback. Vegetables Potatoes (without the skin). Most well-cooked and canned vegetables without skins or seeds. Tender lettuce. Fruits Apple sauce. Fruits canned in juice. Cooked apricots, cherries, grapefruit, peaches, pears, or plums. Fresh bananas and cantaloupe. Meats and other protein foods Baked or boiled chicken. Eggs. Tofu. Fish. Seafood. Smooth nut butters. Ground or well-cooked tender beef, ham, veal, lamb, pork, or poultry. Dairy Plain yogurt, kefir, and unsweetened liquid yogurt. Lactose-free milk, buttermilk, skim milk, or soy milk. Low-fat or nonfat hard cheese. Beverages Water. Low-calorie sports drinks. Fruit juices without pulp. Strained tomato and vegetable juices. Decaffeinated teas. Sugar-free beverages not sweetened with sugar alcohols. Oral rehydration solutions, if approved by your health care provider. Seasoning and other foods Bouillon, broth, or soups made from recommended foods. What foods are not recommended? The items listed may not be a complete list. Talk with your health care provider about what dietary choices are best for you. Grains Whole grain, whole wheat,   bran, or rye breads, rolls, pastas, and crackers. Wild or brown rice. Whole grain or bran cereals. Barley. Oats and  oatmeal. Corn tortillas or taco shells. Granola. Popcorn. Vegetables Raw vegetables. Fried vegetables. Cabbage, broccoli, Brussels sprouts, artichokes, baked beans, beet greens, corn, kale, legumes, peas, sweet potatoes, and yams. Potato skins. Cooked spinach and cabbage. Fruits Dried fruit, including raisins and dates. Raw fruits. Stewed or dried prunes. Canned fruits with syrup. Meat and other protein foods Fried or fatty meats. Deli meats. Chunky nut butters. Nuts and seeds. Beans and lentils. Bacon. Hot dogs. Sausage. Dairy High-fat cheeses. Whole milk, chocolate milk, and beverages made with milk, such as milk shakes. Half-and-half. Cream. sour cream. Ice cream. Beverages Caffeinated beverages (such as coffee, tea, soda, or energy drinks). Alcoholic beverages. Fruit juices with pulp. Prune juice. Soft drinks sweetened with high-fructose corn syrup or sugar alcohols. High-calorie sports drinks. Fats and oils Butter. Cream sauces. Margarine. Salad oils. Plain salad dressings. Olives. Avocados. Mayonnaise. Sweets and desserts Sweet rolls, doughnuts, and sweet breads. Sugar-free desserts sweetened with sugar alcohols such as xylitol and sorbitol. Seasoning and other foods Honey. Hot sauce. Chili powder. Gravy. Cream-based or milk-based soups. Pancakes and waffles. Summary  When you have diarrhea, the foods you eat and your eating habits are very important.  Make sure you get at least 8-10 cups of fluid each day, or enough to keep your urine clear or pale yellow.  Eat bland foods and gradually reintroduce healthy, nutrient-rich foods as tolerated, or as told by your health care provider.  Avoid high-fiber, fried, greasy, or spicy foods. This information is not intended to replace advice given to you by your health care provider. Make sure you discuss any questions you have with your health care provider. Document Released: 06/26/2003 Document Revised: 04/02/2016 Document Reviewed:  04/02/2016 [employer] Interactive Patient Education  2017 [employer]  

## 2016-08-24 NOTE — Progress Notes (Signed)
Re-evaluation of pt. Pt states nausea is better and is tolerating food and ginger ale at this time. Pt states she is cramping and rates it at a 6 but pt says that is normal for her period cramps and usually they are worse.

## 2016-08-24 NOTE — MAU Note (Addendum)
Patient c/o: +chills +fever +Generalized aches +diarrhea +N/V States unable to keep keep anything to eat or drink down.  Patient reports vaginal bleeding that has continued since last visit with lower abdominal cramping.
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
## 2018-08-09 IMAGING — US US TRANSVAGINAL NON-OB
1 series · 15 of 25 positions shown · non-contrast
Comparison: None

CLINICAL DATA: Initial evaluation for acute abdominal/ pelvic
cramping, abnormal uterine bleeding. History of morbid obesity.

EXAM:
TRANSABDOMINAL AND TRANSVAGINAL ULTRASOUND OF PELVIS
TECHNIQUE: Both transabdominal and transvaginal ultrasound examinations of the
pelvis were performed. Transabdominal technique was performed for
global imaging of the pelvis including uterus, ovaries, adnexal
regions, and pelvic cul-de-sac. It was necessary to proceed with
endovaginal exam following the transabdominal exam to visualize the
uterus and ovaries.

[Series 1: us transvaginal non-ob · 15 of 40 slices shown]
[im 1/40]
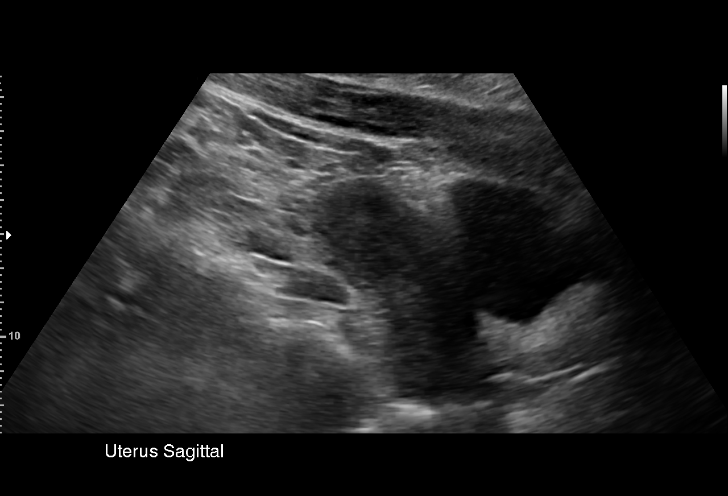
[im 4/40]
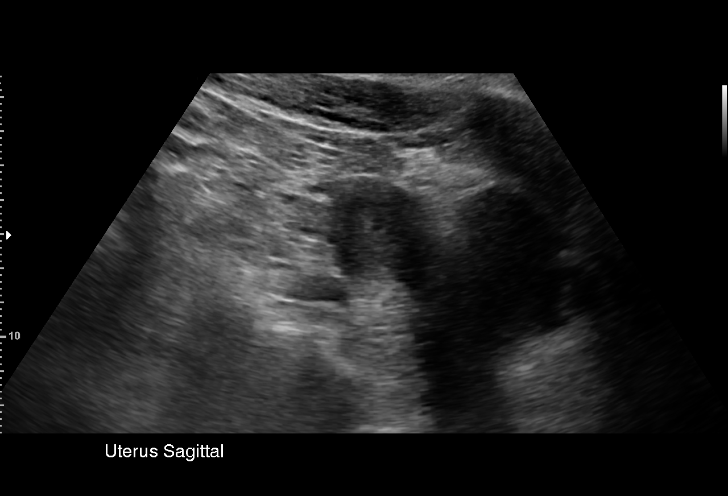
[im 7/40]
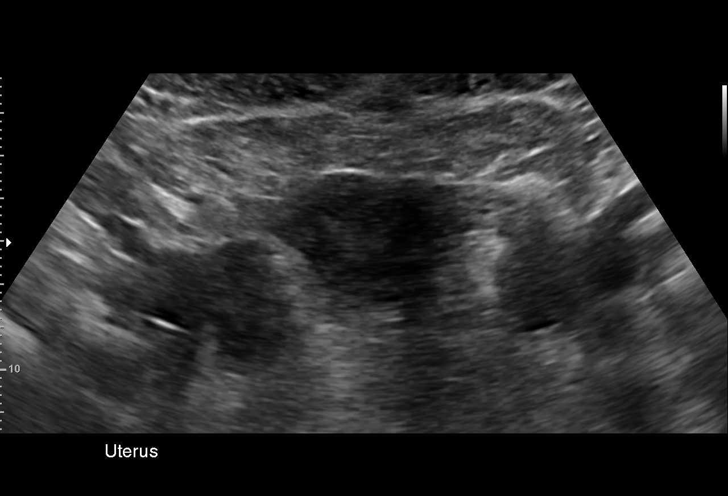
[im 9/40]
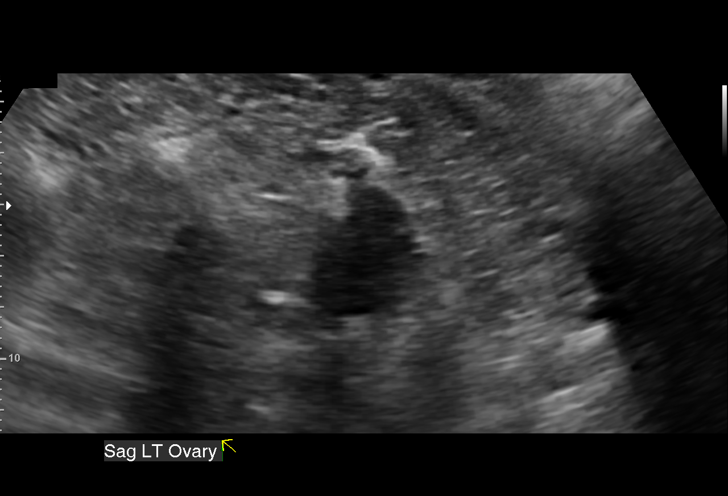
[im 12/40]
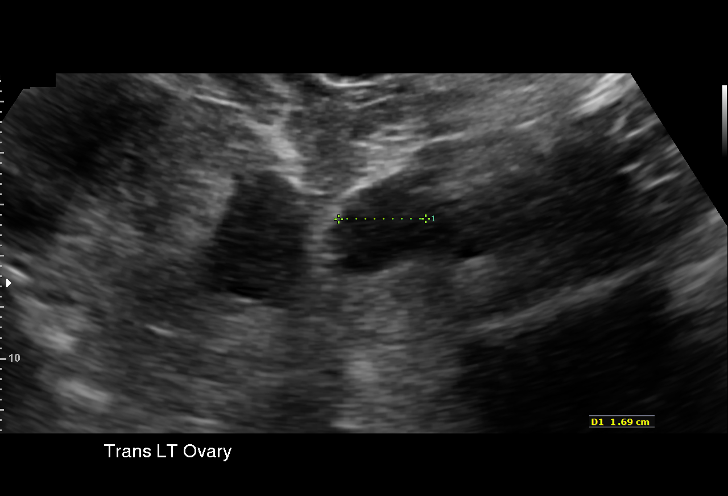
[im 15/40]
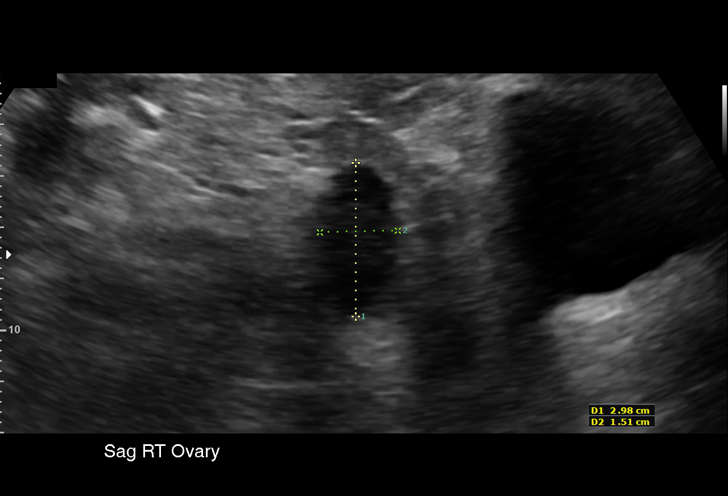
[im 17/40]
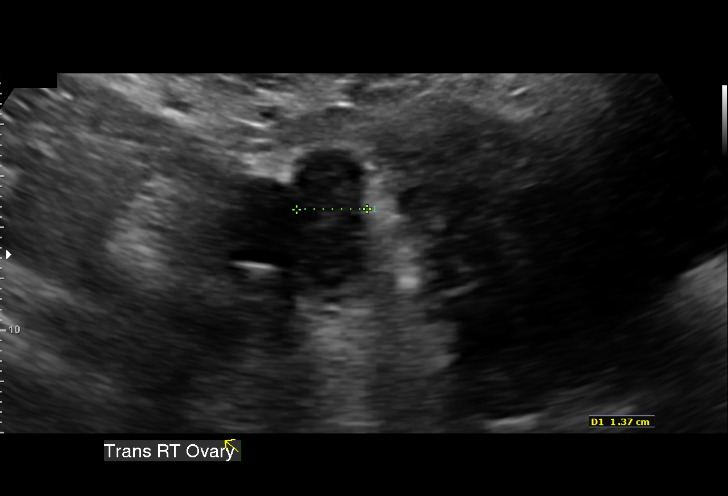
[im 20/40]
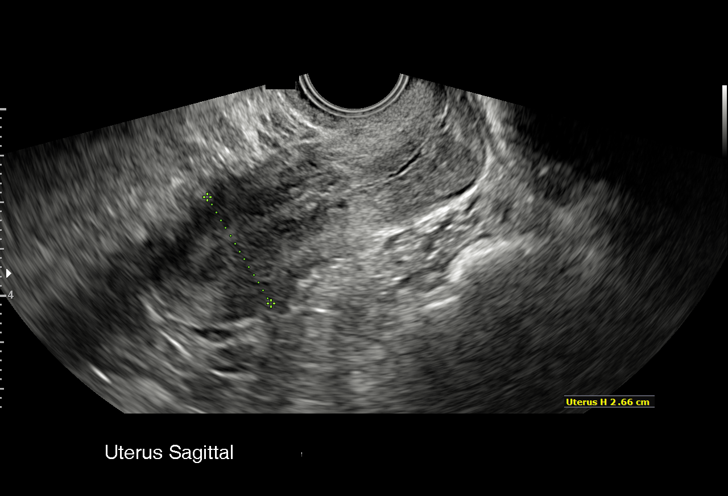
[im 23/40]
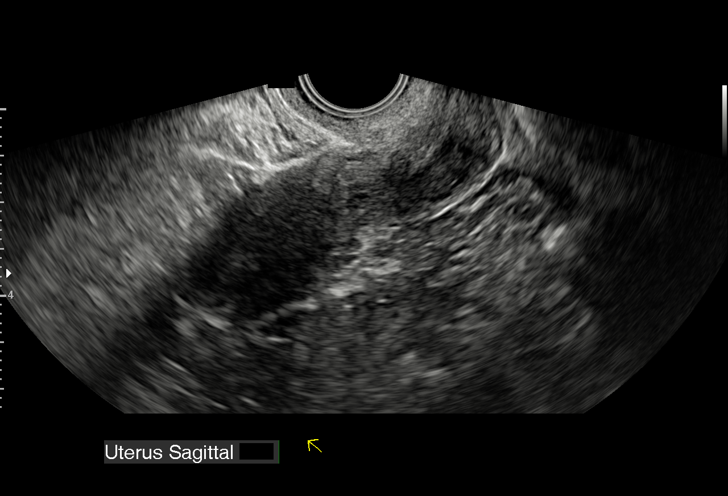
[im 25/40]
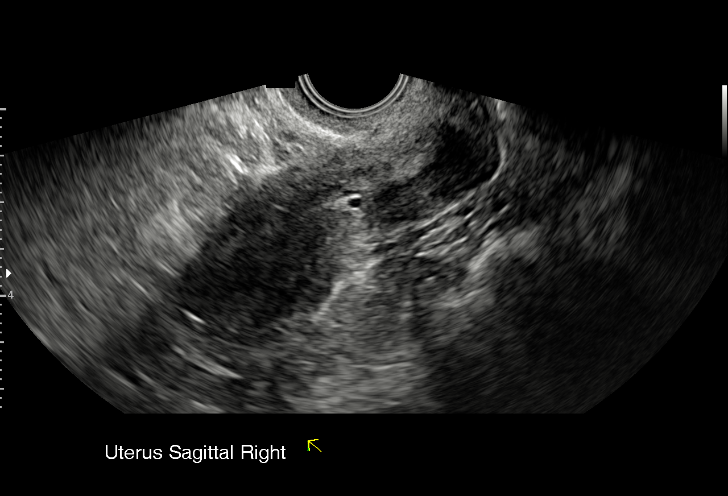
[im 28/40]
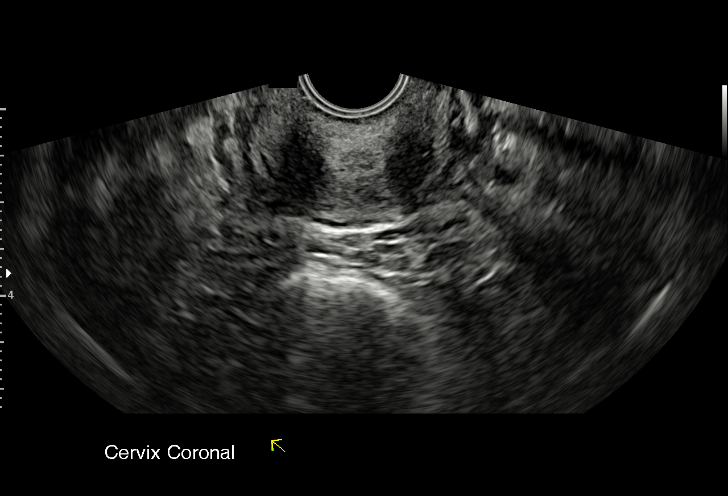
[im 31/40]
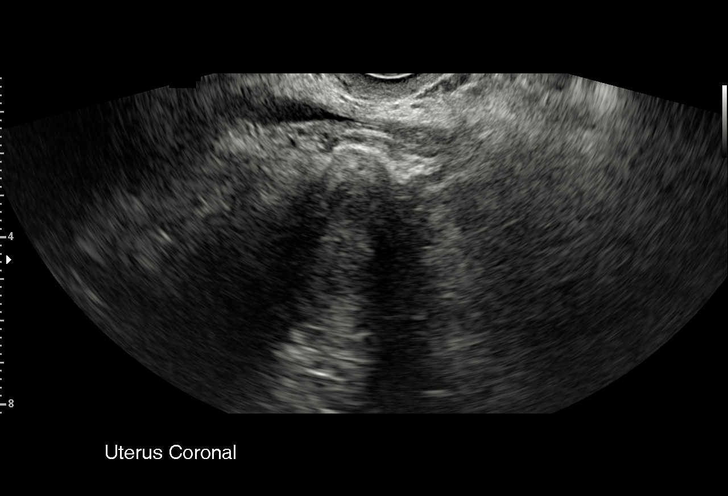
[im 33/40]
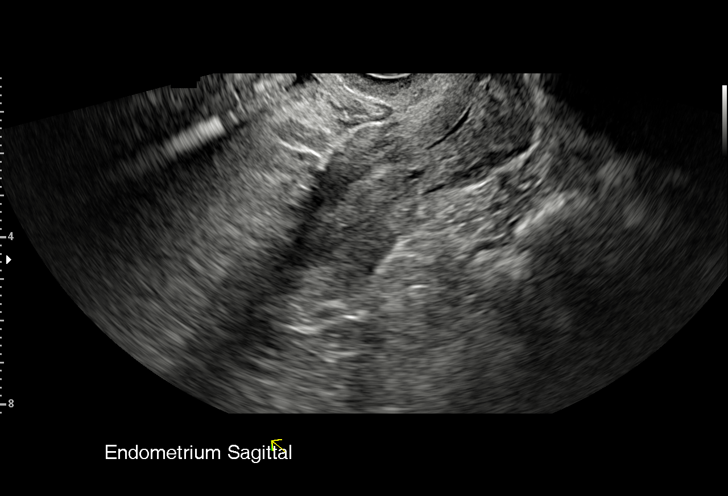
[im 36/40]
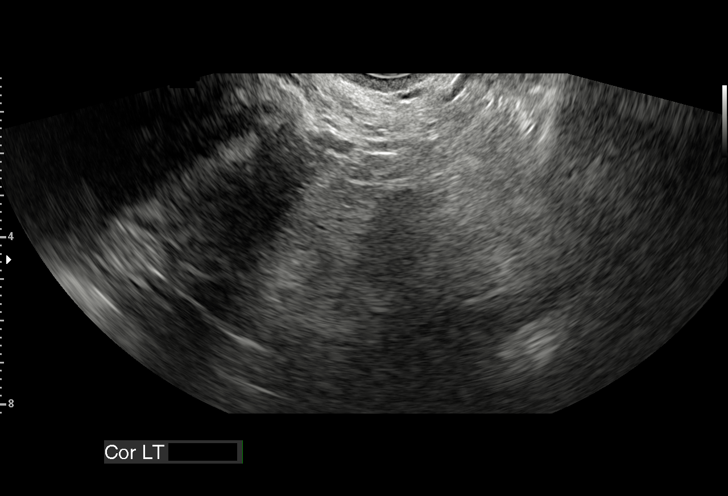
[im 40/40]
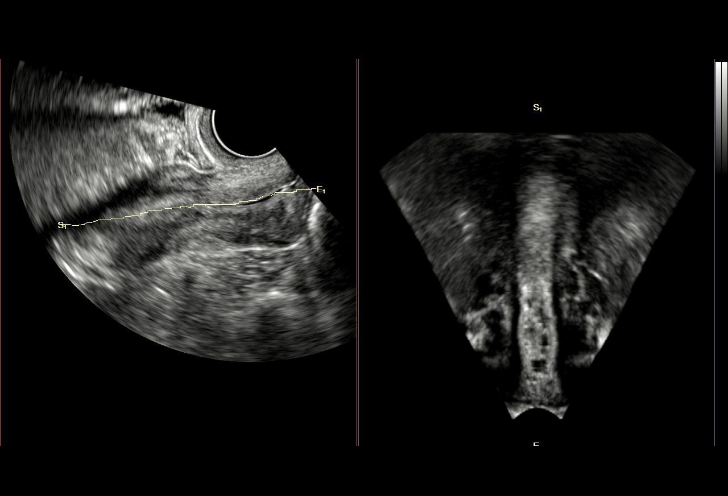

[15 of 25 positions shown; findings below may reference images not displayed]

FINDINGS: Uterus

Measurements: 7.6 x 2.7 x 2.5 cm. No fibroids or other mass
visualized.

Endometrium

Thickness: 8.4 mm.  No focal abnormality visualized.

Right ovary

Measurements: 3.0 x 1.5 x 1.4 cm. Grossly normal appearance/no
adnexal mass.

Left ovary

Measurements: 2.4 x 1.6 x 1.7 cm. Grossly normal appearance/no
adnexal mass.

Other findings

No abnormal free fluid.

Please note study somewhat limited due to patient body habitus.
IMPRESSION: Negative pelvic ultrasound.  No acute abnormality identified.
